# Patient Record
Sex: Female | Born: 1995 | Race: Black or African American | Hispanic: No | Marital: Single | Smoking: Current some day smoker
Health system: Southern US, Community
[De-identification: ages and names within clinical notes are randomized; demographics above are authoritative.]

---

## 2018-08-13 ENCOUNTER — Emergency Department (HOSPITAL_COMMUNITY)
Admission: EM | Admit: 2018-08-13 | Discharge: 2018-08-13 | Disposition: A | Discharge status: Home / Self Care | Payer: 59 | Attending: Emergency Medicine | Admitting: Emergency Medicine

## 2018-08-13 ENCOUNTER — Other Ambulatory Visit: Payer: Self-pay

## 2018-08-13 ENCOUNTER — Emergency Department (HOSPITAL_COMMUNITY): Payer: 59

## 2018-08-13 ENCOUNTER — Encounter (HOSPITAL_COMMUNITY): Payer: Self-pay

## 2018-08-13 DIAGNOSIS — Y998 Other external cause status: Secondary | ICD-10-CM | POA: Diagnosis not present

## 2018-08-13 DIAGNOSIS — S199XXA Unspecified injury of neck, initial encounter: Secondary | ICD-10-CM | POA: Diagnosis not present

## 2018-08-13 DIAGNOSIS — Y92410 Unspecified street and highway as the place of occurrence of the external cause: Secondary | ICD-10-CM | POA: Insufficient documentation

## 2018-08-13 DIAGNOSIS — S299XXA Unspecified injury of thorax, initial encounter: Secondary | ICD-10-CM | POA: Insufficient documentation

## 2018-08-13 DIAGNOSIS — Y93I9 Activity, other involving external motion: Secondary | ICD-10-CM | POA: Diagnosis not present

## 2018-08-13 DIAGNOSIS — M546 Pain in thoracic spine: Secondary | ICD-10-CM

## 2018-08-13 DIAGNOSIS — F17210 Nicotine dependence, cigarettes, uncomplicated: Secondary | ICD-10-CM | POA: Diagnosis not present

## 2018-08-13 MED ORDER — METHOCARBAMOL 500 MG PO TABS: 500.0000 mg | ORAL_TABLET | Freq: Two times a day (BID) | ORAL | 0 refills | Status: AC

## 2018-08-13 MED ORDER — IBUPROFEN 600 MG PO TABS: 600.0000 mg | ORAL_TABLET | Freq: Four times a day (QID) | ORAL | 0 refills | Status: AC | PRN

## 2018-08-13 MED ORDER — ACETAMINOPHEN 500 MG PO TABS: 500.0000 mg | ORAL_TABLET | Freq: Four times a day (QID) | ORAL | 0 refills | Status: AC | PRN
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
## 2018-08-13 NOTE — Discharge Instructions (Signed)
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
